# Patient Record
Sex: Male | Born: 1965 | Race: White | Hispanic: No | Marital: Married | Smoking: Never smoker
Health system: Southern US, Community
[De-identification: ages and names within clinical notes are randomized; demographics above are authoritative.]

## PROBLEM LIST (undated history)

## (undated) HISTORY — PX: KNEE SURGERY: SHX244

---

## 2008-12-01 ENCOUNTER — Encounter: Admission: RE | Admit: 2008-12-01 | Discharge: 2008-12-01 | Payer: Self-pay | Admitting: Chiropractic Medicine

## 2015-06-18 ENCOUNTER — Encounter (HOSPITAL_BASED_OUTPATIENT_CLINIC_OR_DEPARTMENT_OTHER): Payer: Self-pay | Admitting: *Deleted

## 2015-06-18 ENCOUNTER — Emergency Department (HOSPITAL_BASED_OUTPATIENT_CLINIC_OR_DEPARTMENT_OTHER)
Admission: EM | Admit: 2015-06-18 | Discharge: 2015-06-18 | Disposition: A | Discharge status: Home / Self Care | Payer: BLUE CROSS/BLUE SHIELD | Attending: Emergency Medicine | Admitting: Emergency Medicine

## 2015-06-18 DIAGNOSIS — R002 Palpitations: Secondary | ICD-10-CM | POA: Diagnosis not present

## 2015-06-18 DIAGNOSIS — R51 Headache: Secondary | ICD-10-CM | POA: Insufficient documentation

## 2015-06-18 DIAGNOSIS — M542 Cervicalgia: Secondary | ICD-10-CM | POA: Diagnosis not present

## 2015-06-18 LAB — CBC WITH DIFFERENTIAL/PLATELET
BASOS ABS: 0.1 10*3/uL (ref 0.0–0.1)
BASOS PCT: 2 %
EOS ABS: 0.1 10*3/uL (ref 0.0–0.7)
Eosinophils Relative: 1 %
HEMATOCRIT: 46.4 % (ref 39.0–52.0)
HEMOGLOBIN: 16.5 g/dL (ref 13.0–17.0)
Lymphocytes Relative: 30 %
Lymphs Abs: 2.4 10*3/uL (ref 0.7–4.0)
MCH: 30.2 pg (ref 26.0–34.0)
MCHC: 35.6 g/dL (ref 30.0–36.0)
MCV: 84.8 fL (ref 78.0–100.0)
Monocytes Absolute: 0.6 10*3/uL (ref 0.1–1.0)
Monocytes Relative: 8 %
NEUTROS ABS: 4.8 10*3/uL (ref 1.7–7.7)
NEUTROS PCT: 59 %
Platelets: 203 10*3/uL (ref 150–400)
RBC: 5.47 MIL/uL (ref 4.22–5.81)
RDW: 12.4 % (ref 11.5–15.5)
WBC: 7.9 10*3/uL (ref 4.0–10.5)

## 2015-06-18 LAB — BASIC METABOLIC PANEL
ANION GAP: 7 (ref 5–15)
BUN: 14 mg/dL (ref 6–20)
CALCIUM: 9.6 mg/dL (ref 8.9–10.3)
CO2: 26 mmol/L (ref 22–32)
CREATININE: 1.02 mg/dL (ref 0.61–1.24)
Chloride: 107 mmol/L (ref 101–111)
Glucose, Bld: 100 mg/dL — ABNORMAL HIGH (ref 65–99)
Potassium: 3.7 mmol/L (ref 3.5–5.1)
SODIUM: 140 mmol/L (ref 135–145)

## 2015-06-18 LAB — TROPONIN I

## 2015-06-18 NOTE — Discharge Instructions (Signed)
Keep a record of your blood pressure at home to take with you to your next appointment.  If your palpitations persist, see your doctor about arranging a holter monitor.  Return to the ER if your symptoms worsen or change.   Palpitations A palpitation is the feeling that your heartbeat is irregular or is faster than normal. It may feel like your heart is fluttering or skipping a beat. Palpitations are usually not a serious problem. However, in some cases, you may need further medical evaluation. CAUSES  Palpitations can be caused by:  Smoking.  Caffeine or other stimulants, such as diet pills or energy drinks.  Alcohol.  Stress and anxiety.  Strenuous physical activity.  Fatigue.  Certain medicines.  Heart disease, especially if you have a history of irregular heart rhythms (arrhythmias), such as atrial fibrillation, atrial flutter, or supraventricular tachycardia.  An improperly working pacemaker or defibrillator. DIAGNOSIS  To find the cause of your palpitations, your health care provider will take your medical history and perform a physical exam. Your health care provider may also have you take a test called an ambulatory electrocardiogram (ECG). An ECG records your heartbeat patterns over a 24-hour period. You may also have other tests, such as:  Transthoracic echocardiogram (TTE). During echocardiography, sound waves are used to evaluate how blood flows through your heart.  Transesophageal echocardiogram (TEE).  Cardiac monitoring. This allows your health care provider to monitor your heart rate and rhythm in real time.  Holter monitor. This is a portable device that records your heartbeat and can help diagnose heart arrhythmias. It allows your health care provider to track your heart activity for several days, if needed.  Stress tests by exercise or by giving medicine that makes the heart beat faster. TREATMENT  Treatment of palpitations depends on the cause of your  symptoms and can vary greatly. Most cases of palpitations do not require any treatment other than time, relaxation, and monitoring your symptoms. Other causes, such as atrial fibrillation, atrial flutter, or supraventricular tachycardia, usually require further treatment. HOME CARE INSTRUCTIONS   Avoid:  Caffeinated coffee, tea, soft drinks, diet pills, and energy drinks.  Chocolate.  Alcohol.  Stop smoking if you smoke.  Reduce your stress and anxiety. Things that can help you relax include:  A method of controlling things in your body, such as your heartbeats, with your mind (biofeedback).  Yoga.  Meditation.  Physical activity such as swimming, jogging, or walking.  Get plenty of rest and sleep. SEEK MEDICAL CARE IF:   You continue to have a fast or irregular heartbeat beyond 24 hours.  Your palpitations occur more often. SEEK IMMEDIATE MEDICAL CARE IF:  You have chest pain or shortness of breath.  You have a severe headache.  You feel dizzy or you faint. MAKE SURE YOU:  Understand these instructions.  Will watch your condition.  Will get help right away if you are not doing well or get worse.   This information is not intended to replace advice given to you by your health care provider. Make sure you discuss any questions you have with your health care provider.   Document Released: 07/29/2000 Document Revised: 08/06/2013 Document Reviewed: 09/30/2011 Elsevier Interactive Patient Education 2016 [employer] opr

## 2015-06-18 NOTE — ED Notes (Signed)
[facility] He had it checked on his wifes recommendation due to face being flushed. Pain in the back of his neck.

## 2015-06-18 NOTE — ED Provider Notes (Signed)
CSN: [id number]     Arrival date & time 06/18/15  1945 History  By signing my name below, I, Gonzella LexKimberly Bianca Gray, attest that this documentation has been prepared under the direction and in the presence of Geoffery Lyonsouglas Izzac Rockett, MD. Electronically Signed: Gonzella LexKimberly Bianca Gray, Scribe. 06/18/2015. 8:45 PM.    Chief Complaint  Patient presents with  . Hypertension    The history is provided by the patient. No language interpreter was used.    HPI Comments: Gabriel EaringJon Forde is a 49 y.o. male who presents to the Emergency Department complaining of intermittent, moderate palpitations that last about 45 seconds before resolving on their own and elevated BP of 151/109 onset yesterday morning. Pt has associated clammy hands, posterior HA and neck pain. He tried Aleve with no relief. Pt's wife reports increased stress at work lately and pt also reports frequent use of caffeine in the last few days. Pt denies chest pain, SOB, abdominal pain, and lightheadedness. Pt saw a physician in urgent care earlier today who prescried blood pressure medicine. Pt reports being pretty healthy otherwise.  History reviewed. No pertinent past medical history. History reviewed. No pertinent past surgical history. No family history on file. Social History  Substance Use Topics  . Smoking status: Never Smoker   . Smokeless tobacco: None  . Alcohol Use: Yes     Comment: weekends    Review of Systems  Respiratory: Negative for shortness of breath.   Cardiovascular: Positive for palpitations. Negative for chest pain.  Gastrointestinal: Negative for abdominal pain.  Musculoskeletal: Positive for neck pain.  Neurological: Positive for headaches. Negative for light-headedness.  All other systems reviewed and are negative.   Allergies  Review of patient's allergies indicates no known allergies.  Home Medications   Prior to Admission medications   Medication Sig Start Date End Date Taking? Authorizing Provider  Cetirizine HCl  (ZYRTEC PO) Take by mouth.   Yes Historical Provider, MD   BP 137/91 mmHg  Pulse 81  Temp(Src) 98.2 F (36.8 C) (Oral)  Resp 18  Ht 6\' 3"  (1.905 m)  Wt 208 lb (94.348 kg)  BMI 26.00 kg/m2  SpO2 98% Physical Exam  Constitutional: He is oriented to person, place, and time. He appears well-developed and well-nourished. No distress.  HENT:  Head: Normocephalic.  Eyes: Conjunctivae are normal.  Neck: Normal range of motion. Neck supple.  Cardiovascular: Normal rate, regular rhythm and normal heart sounds.   No murmur heard. Pulmonary/Chest: Effort normal and breath sounds normal. No stridor. No respiratory distress. He has no wheezes. He has no rales.  Abdominal: Soft. Bowel sounds are normal. He exhibits no distension. There is no tenderness.  Neurological: He is alert and oriented to person, place, and time.  Skin: Skin is warm and dry.  Psychiatric: He has a normal mood and affect.  Nursing note and vitals reviewed.   ED Course  Procedures  DIAGNOSTIC STUDIES:    Oxygen Saturation is 98% on RA, normal by my interpretation.   COORDINATION OF CARE: 8:04 PM Will order labwork. Discussed treatment plan with pt at bedside and pt agreed to plan.    Labs Review Labs Reviewed - No data to display  Imaging Review No results found. I have personally reviewed and evaluated these lab results as part of my medical decision-making.   EKG Interpretation   Date/Time:  Thursday June 18 2015 20:08:27 EDT Ventricular Rate:  66 PR Interval:  162 QRS Duration: 94 QT Interval:  376 QTC Calculation: 394 R  Axis:   -5 Text Interpretation:  Normal sinus rhythm Normal ECG Confirmed by Kazandra Forstrom   MD, Sherlonda Flater ([id number]) on 06/18/2015 9:03:25 PM      MDM   Final diagnoses:  None    Workup is unremarkable.  His blood pressures are not emergently concerning and I recommend nothing but observation at this time.  He is to keep a record of them at home.  He does report some palpitations this  week.  His labs and ekg are unremarkable.  Will recommend him to follow up with his pcp to discuss a holter monitor if his palpitations persist.  I personally performed the services described in this documentation, which was scribed in my presence. The recorded information has been reviewed and is accurate.         Geoffery Lyons, MD 06/18/15 2154

## 2016-11-28 ENCOUNTER — Emergency Department (HOSPITAL_BASED_OUTPATIENT_CLINIC_OR_DEPARTMENT_OTHER): Payer: BLUE CROSS/BLUE SHIELD

## 2016-11-28 ENCOUNTER — Emergency Department (HOSPITAL_BASED_OUTPATIENT_CLINIC_OR_DEPARTMENT_OTHER)
Admission: EM | Admit: 2016-11-28 | Discharge: 2016-11-28 | Disposition: A | Discharge status: Home / Self Care | Payer: BLUE CROSS/BLUE SHIELD | Attending: Emergency Medicine | Admitting: Emergency Medicine

## 2016-11-28 ENCOUNTER — Encounter (HOSPITAL_BASED_OUTPATIENT_CLINIC_OR_DEPARTMENT_OTHER): Payer: Self-pay | Admitting: Emergency Medicine

## 2016-11-28 DIAGNOSIS — R109 Unspecified abdominal pain: Secondary | ICD-10-CM | POA: Diagnosis present

## 2016-11-28 DIAGNOSIS — R1011 Right upper quadrant pain: Secondary | ICD-10-CM | POA: Insufficient documentation

## 2016-11-28 LAB — CBC WITH DIFFERENTIAL/PLATELET
BASOS PCT: 1 %
Basophils Absolute: 0.1 10*3/uL (ref 0.0–0.1)
EOS ABS: 0.1 10*3/uL (ref 0.0–0.7)
Eosinophils Relative: 1 %
HEMATOCRIT: 45.6 % (ref 39.0–52.0)
HEMOGLOBIN: 16.6 g/dL (ref 13.0–17.0)
LYMPHS ABS: 1.3 10*3/uL (ref 0.7–4.0)
Lymphocytes Relative: 14 %
MCH: 31.1 pg (ref 26.0–34.0)
MCHC: 36.4 g/dL — ABNORMAL HIGH (ref 30.0–36.0)
MCV: 85.6 fL (ref 78.0–100.0)
Monocytes Absolute: 0.8 10*3/uL (ref 0.1–1.0)
Monocytes Relative: 9 %
NEUTROS ABS: 7.1 10*3/uL (ref 1.7–7.7)
NEUTROS PCT: 77 %
Platelets: 161 10*3/uL (ref 150–400)
RBC: 5.33 MIL/uL (ref 4.22–5.81)
RDW: 12.4 % (ref 11.5–15.5)
WBC: 9.3 10*3/uL (ref 4.0–10.5)

## 2016-11-28 LAB — D-DIMER, QUANTITATIVE: D-Dimer, Quant: 0.34 ug/mL-FEU (ref 0.00–0.50)

## 2016-11-28 LAB — URINALYSIS, ROUTINE W REFLEX MICROSCOPIC
BILIRUBIN URINE: NEGATIVE
Glucose, UA: NEGATIVE mg/dL
Hgb urine dipstick: NEGATIVE
KETONES UR: NEGATIVE mg/dL
LEUKOCYTES UA: NEGATIVE
NITRITE: NEGATIVE
Protein, ur: NEGATIVE mg/dL
SPECIFIC GRAVITY, URINE: 1.007 (ref 1.005–1.030)
pH: 6.5 (ref 5.0–8.0)

## 2016-11-28 LAB — COMPREHENSIVE METABOLIC PANEL
ALBUMIN: 4.3 g/dL (ref 3.5–5.0)
ALK PHOS: 57 U/L (ref 38–126)
ALT: 21 U/L (ref 17–63)
AST: 21 U/L (ref 15–41)
Anion gap: 7 (ref 5–15)
BUN: 10 mg/dL (ref 6–20)
CALCIUM: 9.6 mg/dL (ref 8.9–10.3)
CO2: 25 mmol/L (ref 22–32)
CREATININE: 0.96 mg/dL (ref 0.61–1.24)
Chloride: 104 mmol/L (ref 101–111)
GFR calc Af Amer: >60 mL/min (ref >60)
GFR calc non Af Amer: >60 mL/min (ref >60)
GLUCOSE: 106 mg/dL — AB (ref 65–99)
Potassium: 3.7 mmol/L (ref 3.5–5.1)
SODIUM: 136 mmol/L (ref 135–145)
Total Bilirubin: 1.7 mg/dL — ABNORMAL HIGH (ref 0.3–1.2)
Total Protein: 6.9 g/dL (ref 6.5–8.1)

## 2016-11-28 LAB — LIPASE, BLOOD: Lipase: 20 U/L (ref 11–51)

## 2016-11-28 MED ORDER — KETOROLAC TROMETHAMINE 15 MG/ML IJ SOLN
15.0000 mg | Freq: Once | INTRAMUSCULAR | Status: AC
  Administered 2016-11-28: 15 mg via INTRAVENOUS
  Filled 2016-11-28: qty 1

## 2016-11-28 MED ORDER — HYDROCODONE-ACETAMINOPHEN 5-325 MG PO TABS: 1.0000 | ORAL_TABLET | Freq: Four times a day (QID) | ORAL | 0 refills | Status: AC | PRN

## 2016-11-28 NOTE — ED Triage Notes (Signed)
R flank pain that started Saturday. Denies any other symptoms. Ambulatory to room.

## 2016-11-28 NOTE — ED Provider Notes (Addendum)
[facility] Provider Note: Lowella Dell, MD, FACEP  CSN: [id number] MRN: [id number] ARRIVAL: 11/28/16 at 0455 ROOM: [facility]09/[facility]09   CHIEF COMPLAINT  Flank Pain   HISTORY OF PRESENT ILLNESS  Mitchell Kim is a 51 y.o. male who has had the gradual onset of right flank pain over the past 48 hours. The pain is constant and has both sharp and dull components. He localizes the pain in the right flank radiating into his right upper quadrant. Pain is worse with movement, coughing and deep breathing. He has had no fever, nausea, vomiting, diarrhea, dysuria or hematuria. There is no associated rash. Pain is moderate to severe.  Consultation with the [facility] state controlled substances database reveals the patient has received No opioid prescriptions the past year.  History reviewed. No pertinent past medical history.  History reviewed. No pertinent surgical history.  History reviewed. No pertinent family history.  Social History  Substance Use Topics  . Smoking status: Never Smoker  . Smokeless tobacco: Never Used  . Alcohol use Yes     Comment: weekends    Prior to Admission medications   Medication Sig Start Date End Date Taking? Authorizing Provider  Cetirizine HCl (ZYRTEC PO) Take by mouth.    Historical Provider, MD  HYDROcodone-acetaminophen (NORCO) 5-325 MG tablet Take 1 tablet by mouth every 6 (six) hours as needed (for pain). 11/28/16   Paula Libra, MD    Allergies Patient has no known allergies.   REVIEW OF SYSTEMS  Negative except as noted here or in the History of Present Illness.   PHYSICAL EXAMINATION  Initial Vital Signs Blood pressure (!) 152/94, pulse 84, resp. rate 19, height  (1.905 m), weight 215 lb (97.5 kg), SpO2 98 %.  Examination General: Well-developed, well-nourished male in no acute distress; appearance consistent with age of record HENT: normocephalic; atraumatic Eyes: pupils equal, round and reactive to light; extraocular  muscles intact Neck: supple Heart: regular rate and rhythm Lungs: Decreased breath sounds right base Abdomen: soft; nondistended; right upper quadrant tenderness without definite Murphy sign; no masses or hepatosplenomegaly; bowel sounds present GU: Right flank tenderness, well localized Extremities: No deformity; full range of motion; pulses normal Neurologic: Awake, alert and oriented; motor function intact in all extremities and symmetric; no facial droop Skin: Warm and dry; no rash seen Psychiatric: Normal mood and affect   RESULTS  Summary of this visit's results, reviewed by myself:   EKG Interpretation  Date/Time:    Ventricular Rate:    PR Interval:    QRS Duration:   QT Interval:    QTC Calculation:   R Axis:     Text Interpretation:        Laboratory Studies: Results for orders placed or performed during the hospital encounter of 11/28/16 (from the past 24 hour(s))  Urinalysis, Routine w reflex microscopic     Status: None   Collection Time: 11/28/16  5:10 AM  Result Value Ref Range   Color, Urine YELLOW YELLOW   APPearance CLEAR CLEAR   Specific Gravity, Urine 1.007 1.005 - 1.030   pH 6.5 5.0 - 8.0   Glucose, UA NEGATIVE NEGATIVE mg/dL   Hgb urine dipstick NEGATIVE NEGATIVE   Bilirubin Urine NEGATIVE NEGATIVE   Ketones, ur NEGATIVE NEGATIVE mg/dL   Protein, ur NEGATIVE NEGATIVE mg/dL   Nitrite NEGATIVE NEGATIVE   Leukocytes, UA NEGATIVE NEGATIVE  CBC with Differential/Platelet     Status: Abnormal   Collection Time: 11/28/16  5:43 AM  Result  Value Ref Range   WBC 9.3 4.0 - 10.5 K/uL   RBC 5.33 4.22 - 5.81 MIL/uL   Hemoglobin 16.6 13.0 - 17.0 g/dL   HCT 16.1 09.6 - 04.5 %   MCV 85.6 78.0 - 100.0 fL   MCH 31.1 26.0 - 34.0 pg   MCHC 36.4 (H) 30.0 - 36.0 g/dL   RDW 40.9 81.1 - 91.4 %   Platelets 161 150 - 400 K/uL   Neutrophils Relative % 77 %   Neutro Abs 7.1 1.7 - 7.7 K/uL   Lymphocytes Relative 14 %   Lymphs Abs 1.3 0.7 - 4.0 K/uL   Monocytes  Relative 9 %   Monocytes Absolute 0.8 0.1 - 1.0 K/uL   Eosinophils Relative 1 %   Eosinophils Absolute 0.1 0.0 - 0.7 K/uL   Basophils Relative 1 %   Basophils Absolute 0.1 0.0 - 0.1 K/uL  Comprehensive metabolic panel     Status: Abnormal   Collection Time: 11/28/16  5:43 AM  Result Value Ref Range   Sodium 136 135 - 145 mmol/L   Potassium 3.7 3.5 - 5.1 mmol/L   Chloride 104 101 - 111 mmol/L   CO2 25 22 - 32 mmol/L   Glucose, Bld 106 (H) 65 - 99 mg/dL   BUN 10 6 - 20 mg/dL   Creatinine, Ser 7.82 0.61 - 1.24 mg/dL   Calcium 9.6 8.9 - 95.6 mg/dL   Total Protein 6.9 6.5 - 8.1 g/dL   Albumin 4.3 3.5 - 5.0 g/dL   AST 21 15 - 41 U/L   ALT 21 17 - 63 U/L   Alkaline Phosphatase 57 38 - 126 U/L   Total Bilirubin 1.7 (H) 0.3 - 1.2 mg/dL   GFR calc non Af Amer >60 >60 mL/min   GFR calc Af Amer >60 >60 mL/min   Anion gap 7 5 - 15  Lipase, blood     Status: None   Collection Time: 11/28/16  5:43 AM  Result Value Ref Range   Lipase 20 11 - 51 U/L  D-dimer, quantitative (not at [facility])     Status: None   Collection Time: 11/28/16  5:43 AM  Result Value Ref Range   D-Dimer, Quant 0.34 0.00 - 0.50 ug/mL-FEU   Imaging Studies: Ct Renal Stone Study  Result Date: 11/28/2016 CLINICAL DATA:  Right flank pain for 2 days EXAM: CT ABDOMEN AND PELVIS WITHOUT CONTRAST TECHNIQUE: Multidetector CT imaging of the abdomen and pelvis was performed following the standard protocol without IV contrast. COMPARISON:  None. FINDINGS: Lower chest: No pulmonary nodules or pleural effusion. No visible pericardial effusion. Bibasilar dependent atelectasis. Hepatobiliary: Normal noncontrast appearance of the liver. No visible biliary dilatation. Cholelithiasis without acute inflammation. Pancreas: Normal noncontrast appearance of the pancreas. No peripancreatic fluid collection. Spleen: Normal. Adrenal glands: Normal. Urinary Tract: --Right kidney/ureter: No hydronephrosis or perinephric stranding. No nephrolithiasis. No  obstructing ureteral stones. --Left kidney/ureter: No hydronephrosis or perinephric stranding. No nephrolithiasis. No obstructing ureteral stones. --Urinary bladder: Unremarkable. Stomach/Bowel: No dilated loops of bowel. No evidence of colonic or enteric inflammation. No fluid collection within the abdomen. The appendix measures upper limits of normal, but there is no surrounding free fluid or inflammatory change. Vascular/Lymphatic: There is atherosclerotic calcification of the non aneurysmal abdominal aorta. No abdominal or pelvic lymphadenopathy. Reproductive: Mild enlargement of the prostate. Musculoskeletal. No focal osseous lesion. Normal visualized extraperitoneal and extrathoracic soft tissues. IMPRESSION: 1. No obstructive uropathy or nephrolithiasis. 2. Appendix measures at the upper limits of normal,  but there is no periappendiceal fluid or inflammatory stranding. 3. Aortic atherosclerosis. Electronically Signed   By: Deatra Robinson M.D.   On: 11/28/2016 06:42    ED COURSE  Nursing notes and initial vitals signs, including pulse oximetry, reviewed.  Vitals:   11/28/16 0509 11/28/16 0510  BP: (!) 152/94   Pulse: 84   Resp: 19   Temp: 98.8 F (37.1 C)   TempSrc: Oral   SpO2: 98%   Weight:  215 lb (97.5 kg)  Height:   (1.905 m)   6:50 AM Patient advised of CT and lab findings. I doubt early appendicitis as his symptoms of been present for 48 hours and there are no inflammatory changes seen on CT. He was advised to be vigilant however and to return if symptoms are worsening or he develops new symptoms such as fever. We will treat his pain in the meantime.  PROCEDURES    ED DIAGNOSES     ICD-9-CM ICD-10-CM   1. Right flank pain 789.09 R10.9        Paula Libra, MD 11/28/16 4098    Paula Libra, MD 11/28/16 [id number]

## 2019-07-06 ENCOUNTER — Emergency Department (HOSPITAL_BASED_OUTPATIENT_CLINIC_OR_DEPARTMENT_OTHER): Payer: Managed Care, Other (non HMO)

## 2019-07-06 ENCOUNTER — Emergency Department (HOSPITAL_BASED_OUTPATIENT_CLINIC_OR_DEPARTMENT_OTHER)
Admission: EM | Admit: 2019-07-06 | Discharge: 2019-07-07 | Disposition: A | Discharge status: Home / Self Care | Payer: Managed Care, Other (non HMO) | Attending: Emergency Medicine | Admitting: Emergency Medicine

## 2019-07-06 ENCOUNTER — Encounter (HOSPITAL_BASED_OUTPATIENT_CLINIC_OR_DEPARTMENT_OTHER): Payer: Self-pay | Admitting: Emergency Medicine

## 2019-07-06 ENCOUNTER — Other Ambulatory Visit: Payer: Self-pay

## 2019-07-06 DIAGNOSIS — R1013 Epigastric pain: Secondary | ICD-10-CM

## 2019-07-06 DIAGNOSIS — R079 Chest pain, unspecified: Secondary | ICD-10-CM | POA: Diagnosis not present

## 2019-07-06 DIAGNOSIS — R109 Unspecified abdominal pain: Secondary | ICD-10-CM | POA: Diagnosis not present

## 2019-07-06 LAB — CBC WITH DIFFERENTIAL/PLATELET
Abs Immature Granulocytes: 0.05 10*3/uL (ref 0.00–0.07)
Basophils Absolute: 0.2 10*3/uL — ABNORMAL HIGH (ref 0.0–0.1)
Basophils Relative: 1 %
Eosinophils Absolute: 0.1 10*3/uL (ref 0.0–0.5)
Eosinophils Relative: 0 %
HCT: 47.3 % (ref 39.0–52.0)
Hemoglobin: 16.5 g/dL (ref 13.0–17.0)
Immature Granulocytes: 0 %
Lymphocytes Relative: 19 %
Lymphs Abs: 2.3 10*3/uL (ref 0.7–4.0)
MCH: 30.4 pg (ref 26.0–34.0)
MCHC: 34.9 g/dL (ref 30.0–36.0)
MCV: 87.3 fL (ref 80.0–100.0)
Monocytes Absolute: 0.6 10*3/uL (ref 0.1–1.0)
Monocytes Relative: 5 %
Neutro Abs: 8.8 10*3/uL — ABNORMAL HIGH (ref 1.7–7.7)
Neutrophils Relative %: 75 %
Platelets: 210 10*3/uL (ref 150–400)
RBC: 5.42 MIL/uL (ref 4.22–5.81)
RDW: 11.8 % (ref 11.5–15.5)
WBC: 11.9 10*3/uL — ABNORMAL HIGH (ref 4.0–10.5)
nRBC: 0 % (ref 0.0–0.2)

## 2019-07-06 LAB — URINALYSIS, ROUTINE W REFLEX MICROSCOPIC
Bilirubin Urine: NEGATIVE
Glucose, UA: NEGATIVE mg/dL
Hgb urine dipstick: NEGATIVE
Ketones, ur: 15 mg/dL — AB
Leukocytes,Ua: NEGATIVE
Nitrite: NEGATIVE
Protein, ur: NEGATIVE mg/dL
Specific Gravity, Urine: 1.01 (ref 1.005–1.030)
pH: 7 (ref 5.0–8.0)

## 2019-07-06 LAB — COMPREHENSIVE METABOLIC PANEL
ALT: 33 U/L (ref 0–44)
AST: 28 U/L (ref 15–41)
Albumin: 4.6 g/dL (ref 3.5–5.0)
Alkaline Phosphatase: 66 U/L (ref 38–126)
Anion gap: 11 (ref 5–15)
BUN: 19 mg/dL (ref 6–20)
CO2: 24 mmol/L (ref 22–32)
Calcium: 9.6 mg/dL (ref 8.9–10.3)
Chloride: 104 mmol/L (ref 98–111)
Creatinine, Ser: 1.19 mg/dL (ref 0.61–1.24)
GFR calc Af Amer: >60 mL/min (ref >60)
GFR calc non Af Amer: >60 mL/min (ref >60)
Glucose, Bld: 127 mg/dL — ABNORMAL HIGH (ref 70–99)
Potassium: 3.4 mmol/L — ABNORMAL LOW (ref 3.5–5.1)
Sodium: 139 mmol/L (ref 135–145)
Total Bilirubin: 1.2 mg/dL (ref 0.3–1.2)
Total Protein: 7.2 g/dL (ref 6.5–8.1)

## 2019-07-06 LAB — LIPASE, BLOOD: Lipase: 31 U/L (ref 11–51)

## 2019-07-06 LAB — TROPONIN I (HIGH SENSITIVITY): Troponin I (High Sensitivity): <2 ng/L (ref <18)

## 2019-07-06 MED ORDER — PANTOPRAZOLE SODIUM 40 MG PO TBEC: 40.0000 mg | DELAYED_RELEASE_TABLET | Freq: Every day | ORAL | 0 refills | Status: AC

## 2019-07-06 MED ORDER — IOHEXOL 350 MG/ML SOLN
100.0000 mL | Freq: Once | INTRAVENOUS | Status: AC | PRN
  Administered 2019-07-06: 23:00:00 100 mL via INTRAVENOUS

## 2019-07-06 MED ORDER — FAMOTIDINE IN NACL 20-0.9 MG/50ML-% IV SOLN
20.0000 mg | Freq: Once | INTRAVENOUS | Status: AC
  Administered 2019-07-06: 22:00:00 20 mg via INTRAVENOUS
  Filled 2019-07-06: qty 50

## 2019-07-06 MED ORDER — ONDANSETRON HCL 4 MG/2ML IJ SOLN
4.0000 mg | Freq: Once | INTRAMUSCULAR | Status: AC
  Administered 2019-07-06: 4 mg via INTRAVENOUS
  Filled 2019-07-06: qty 2

## 2019-07-06 MED ORDER — MORPHINE SULFATE (PF) 4 MG/ML IV SOLN
4.0000 mg | Freq: Once | INTRAVENOUS | Status: AC
  Administered 2019-07-06: 4 mg via INTRAVENOUS
  Filled 2019-07-06: qty 1

## 2019-07-06 MED ORDER — ONDANSETRON 4 MG PO TBDP: ORAL_TABLET | ORAL | 0 refills | Status: DC

## 2019-07-06 MED ORDER — SODIUM CHLORIDE 0.9 % IV BOLUS
1000.0000 mL | Freq: Once | INTRAVENOUS | Status: AC
  Administered 2019-07-06: 22:00:00 1000 mL via INTRAVENOUS

## 2019-07-06 NOTE — ED Provider Notes (Addendum)
[facility] EMERGENCY DEPARTMENT Provider Note   CSN: [id number] Arrival date & time: 07/06/19  2035     History   Chief Complaint Chief Complaint  Patient presents with  . Abdominal Pain  . Back Pain    HPI Mitchell Kim is a 53 y.o. male here presenting with abdominal pain, chest pain, back pain.  Patient states that about 4 hours prior to arrival, he had sudden onset of epigastric pain radiated to his back .  He states that he felt diaphoretic and was sweaty.  He never had this kind of pain before.  Denies any abdominal surgeries or history of heart problems.  Patient appears diaphoretic in triage.     The history is provided by the patient.    History reviewed. No pertinent past medical history.  There are no active problems to display for this patient.   Past Surgical History:  Procedure Laterality Date  . KNEE SURGERY          Home Medications    Prior to Admission medications   Medication Sig Start Date End Date Taking? Authorizing Provider  Cetirizine HCl (ZYRTEC PO) Take by mouth.    [provider]  HYDROcodone-acetaminophen (NORCO) 5-325 MG tablet Take 1 tablet by mouth every 6 (six) hours as needed (for pain). 11/28/16   Molpus, John, MD  ondansetron (ZOFRAN ODT) 4 MG disintegrating tablet 4mg  ODT q4 hours prn nausea/vomit 07/06/19   Charlynne PanderYao, Christl Fessenden Hsienta, MD  pantoprazole (PROTONIX) 40 MG tablet Take 1 tablet (40 mg total) by mouth daily. 07/06/19   Charlynne PanderYao, Mose Colaizzi Hsienta, MD    Family History Family History  Problem Relation Age of Onset  . Cancer Mother     Social History Social History   Tobacco Use  . Smoking status: Never Smoker  . Smokeless tobacco: Never Used  Substance Use Topics  . Alcohol use: Yes    Comment: rare  . Drug use: No     Allergies   Patient has no known allergies.   Review of Systems Review of Systems  Gastrointestinal: Positive for abdominal pain.  Musculoskeletal: Positive for back pain.  All  other systems reviewed and are negative.    Physical Exam Updated Vital Signs BP 123/72   Pulse 75   Temp 98 F (36.7 C) (Oral)   Resp 11   Ht 6\' 3"  (1.905 m)   Wt 96.2 kg   SpO2 93%   BMI 26.50 kg/m   Physical Exam Vitals signs and nursing note reviewed.  Constitutional:      Comments: Uncomfortable, writhing around in pain   HENT:     Head: Normocephalic.     Mouth/Throat:     Mouth: Mucous membranes are moist.  Eyes:     Extraocular Movements: Extraocular movements intact.  Cardiovascular:     Rate and Rhythm: Normal rate and regular rhythm.     Heart sounds: Normal heart sounds.  Pulmonary:     Effort: Pulmonary effort is normal.     Breath sounds: Normal breath sounds.  Abdominal:     General: Abdomen is flat.     Comments: + RUQ tenderness, mild R CVAT   Skin:    General: Skin is warm.     Capillary Refill: Capillary refill takes less than 2 seconds.  Neurological:     General: No focal deficit present.     Mental Status: He is alert and oriented to person, place, and time.  Psychiatric:  Mood and Affect: Mood normal.        Behavior: Behavior normal.      ED Treatments / Results  Labs (all labs ordered are listed, but only abnormal results are displayed) Labs Reviewed  CBC WITH DIFFERENTIAL/PLATELET - Abnormal; Notable for the following components:      Result Value   WBC 11.9 (*)    Neutro Abs 8.8 (*)    Basophils Absolute 0.2 (*)    All other components within normal limits  COMPREHENSIVE METABOLIC PANEL - Abnormal; Notable for the following components:   Potassium 3.4 (*)    Glucose, Bld 127 (*)    All other components within normal limits  LIPASE, BLOOD  URINALYSIS, ROUTINE W REFLEX MICROSCOPIC  TROPONIN I (HIGH SENSITIVITY)  TROPONIN I (HIGH SENSITIVITY)    EKG EKG Interpretation  Date/Time:  Saturday July 06 2019 21:12:23 EST Ventricular Rate:  81 PR Interval:    QRS Duration: 101 QT Interval:  391 QTC Calculation:  454 R Axis:   -10 Text Interpretation: Sinus rhythm No significant change since last tracing Confirmed by Wandra Arthurs [id number]) on 07/06/2019 9:31:50 PM   Radiology Ct Angio Chest/abd/pel For Dissection W And/or Wo Contrast  Result Date: 07/06/2019 CLINICAL DATA:  Chest and back pain. Concern for aortic dissection. EXAM: CT ANGIOGRAPHY CHEST, ABDOMEN AND PELVIS TECHNIQUE: Multidetector CT imaging through the chest, abdomen and pelvis was performed using the standard protocol during bolus administration of intravenous contrast. Multiplanar reconstructed images and MIPs were obtained and reviewed to evaluate the vascular anatomy. CONTRAST:  166mL OMNIPAQUE IOHEXOL 350 MG/ML SOLN COMPARISON:  CT dated 11/28/2016. FINDINGS: CTA CHEST FINDINGS Cardiovascular: There is no evidence for an aortic dissection, however evaluation is limited by suboptimal contrast bolus timing. There is no large centrally located pulmonary embolism. Detection of smaller segmental and subsegmental pulmonary emboli is limited by suboptimal contrast bolus timing. There are no significant atherosclerotic changes of the thoracic aorta. There is no aneurysm of the thoracic aorta. The heart size is normal. There is no pericardial effusion. Mediastinum/Nodes: --No mediastinal or hilar lymphadenopathy. --No axillary lymphadenopathy. --No supraclavicular lymphadenopathy. --Normal thyroid gland. --The esophagus is unremarkable Lungs/Pleura: No pulmonary nodules or masses. No pleural effusion or pneumothorax. No focal airspace consolidation. No focal pleural abnormality. Musculoskeletal: No chest wall abnormality. No acute or significant osseous findings. Review of the MIP images confirms the above findings. CTA ABDOMEN AND PELVIS FINDINGS VASCULAR Aorta: Normal caliber aorta without aneurysm, dissection, vasculitis or significant stenosis. Celiac: Patent without evidence of aneurysm, dissection, vasculitis or significant stenosis. SMA: Patent  without evidence of aneurysm, dissection, vasculitis or significant stenosis. Renals: Both renal arteries are patent without evidence of aneurysm, dissection, vasculitis, fibromuscular dysplasia or significant stenosis. IMA: Patent without evidence of aneurysm, dissection, vasculitis or significant stenosis. Inflow: Patent without evidence of aneurysm, dissection, vasculitis or significant stenosis. Veins: No obvious venous abnormality within the limitations of this arterial phase study. Review of the MIP images confirms the above findings. NON-VASCULAR Hepatobiliary: The liver is normal. Normal gallbladder.There is no biliary ductal dilation. Pancreas: Normal contours without ductal dilatation. No peripancreatic fluid collection. Spleen: No splenic laceration or hematoma. Adrenals/Urinary Tract: --Adrenal glands: No adrenal hemorrhage. --Right kidney/ureter: No hydronephrosis or perinephric hematoma. --Left kidney/ureter: No hydronephrosis or perinephric hematoma. --Urinary bladder: Unremarkable. Stomach/Bowel: --Stomach/Duodenum: No hiatal hernia or other gastric abnormality. Normal duodenal course and caliber. --Small bowel: No dilatation or inflammation. --Colon: No focal abnormality. --Appendix: Normal. Lymphatic: --No retroperitoneal lymphadenopathy. --No mesenteric lymphadenopathy. --No pelvic or  inguinal lymphadenopathy. Reproductive: Unremarkable Other: No ascites or free air. There are bilateral fat containing inguinal hernias. Musculoskeletal. No acute displaced fractures. Review of the MIP images confirms the above findings. IMPRESSION: No acute thoracic, abdominal or pelvic pathology. Specifically, there is no evidence for aortic dissection, however, evaluation is limited by suboptimal contrast bolus timing. There is no large centrally located pulmonary embolism. Detection of smaller segmental and subsegmental pulmonary emboli is limited by suboptimal contrast bolus timing. Electronically Signed   By:  Katherine Mantle M.D.   On: 07/06/2019 23:04    Procedures Procedures (including critical care time)   EMERGENCY DEPARTMENT BILIARY ULTRASOUND INTERPRETATION "Study: Limited Abdominal Ultrasound of the Gallbladder and Common Bile Duct."  INDICATIONS: Abdominal pain Indication: Multiple views of the gallbladder and common bile duct were obtained in real-time with a Multi-frequency probe."  PERFORMED BY:  Myself IMAGES ARCHIVED?: Yes LIMITATIONS: Body habitus INTERPRETATION: Normal   Medications Ordered in ED Medications  sodium chloride 0.9 % bolus 1,000 mL (1,000 mLs Intravenous New Bag/Given 07/06/19 2138)  morphine 4 MG/ML injection 4 mg (4 mg Intravenous Given 07/06/19 2140)  ondansetron (ZOFRAN) injection 4 mg (4 mg Intravenous Given 07/06/19 2139)  famotidine (PEPCID) IVPB 20 mg premix (0 mg Intravenous Stopped 07/06/19 2213)  iohexol (OMNIPAQUE) 350 MG/ML injection 100 mL (100 mLs Intravenous Contrast Given 07/06/19 2235)     Initial Impression / Assessment and Plan / ED Course  I have reviewed the triage vital signs and the nursing notes.  Pertinent labs & imaging results that were available during my care of the patient were reviewed by me and considered in my medical decision making (see chart for details).       Rydge Texidor is a 53 y.o. male here with ab pain, back pain.  He appears very uncomfortable.  Bedside right upper quadrant ultrasound showed no obvious gallstones .  Will get CTA to rule out dissection. Otherwise, consider gastritis vs renal colic, less likely ACS.   11:36 PM Labs unremarkable. Trop neg x 1. CTA showed no dissection. Second troponin and UA pending. Likely biliary colic vs gastritis. Anticipate dc home if trop and UA unremarkable. Signed out to Dr. Nicanor Alcon in the ED. Will refer to GI outpatient.   Final Clinical Impressions(s) / ED Diagnoses   Final diagnoses:  Epigastric pain  Flank pain    ED Discharge Orders         Ordered     ondansetron (ZOFRAN ODT) 4 MG disintegrating tablet     07/06/19 2325    pantoprazole (PROTONIX) 40 MG tablet  Daily     07/06/19 2325           Charlynne Pander, MD 07/06/19 2325    Charlynne Pander, MD 07/06/19 [phone]

## 2019-07-06 NOTE — ED Triage Notes (Signed)
Pt is c/o upper abd pain and back pain   Pt states it started about 4 hours ago  Denies N/V/D

## 2019-07-06 NOTE — Discharge Instructions (Signed)
Take protonix daily   Take zofran as needed for nausea.   You may have passed gallstone   See your doctor. Consider following up with GI   Return to ER if you have worse abdominal pain, vomiting, fever, chest pain

## 2019-07-06 NOTE — ED Notes (Signed)
While triaging pt he became pale and diaphoretic

## 2019-07-06 NOTE — ED Notes (Signed)
Pt is in CT

## 2019-07-06 NOTE — ED Notes (Signed)
Returned from CT.

## 2019-07-07 LAB — TROPONIN I (HIGH SENSITIVITY): Troponin I (High Sensitivity): 3 ng/L (ref <18)

## 2020-03-22 ENCOUNTER — Emergency Department (HOSPITAL_BASED_OUTPATIENT_CLINIC_OR_DEPARTMENT_OTHER): Payer: BC Managed Care – PPO

## 2020-03-22 ENCOUNTER — Emergency Department (HOSPITAL_BASED_OUTPATIENT_CLINIC_OR_DEPARTMENT_OTHER)
Admission: EM | Admit: 2020-03-22 | Discharge: 2020-03-23 | Disposition: A | Discharge status: Home / Self Care | Payer: BC Managed Care – PPO | Attending: Emergency Medicine | Admitting: Emergency Medicine

## 2020-03-22 ENCOUNTER — Encounter (HOSPITAL_BASED_OUTPATIENT_CLINIC_OR_DEPARTMENT_OTHER): Payer: Self-pay | Admitting: Emergency Medicine

## 2020-03-22 DIAGNOSIS — M549 Dorsalgia, unspecified: Secondary | ICD-10-CM | POA: Diagnosis not present

## 2020-03-22 DIAGNOSIS — R112 Nausea with vomiting, unspecified: Secondary | ICD-10-CM

## 2020-03-22 DIAGNOSIS — N401 Enlarged prostate with lower urinary tract symptoms: Secondary | ICD-10-CM | POA: Diagnosis not present

## 2020-03-22 DIAGNOSIS — N400 Enlarged prostate without lower urinary tract symptoms: Secondary | ICD-10-CM

## 2020-03-22 DIAGNOSIS — R109 Unspecified abdominal pain: Secondary | ICD-10-CM | POA: Diagnosis not present

## 2020-03-22 LAB — BASIC METABOLIC PANEL
Anion gap: 14 (ref 5–15)
BUN: 15 mg/dL (ref 6–20)
CO2: 21 mmol/L — ABNORMAL LOW (ref 22–32)
Calcium: 9.7 mg/dL (ref 8.9–10.3)
Chloride: 105 mmol/L (ref 98–111)
Creatinine, Ser: 1.11 mg/dL (ref 0.61–1.24)
GFR calc Af Amer: >60 mL/min (ref >60)
GFR calc non Af Amer: >60 mL/min (ref >60)
Glucose, Bld: 143 mg/dL — ABNORMAL HIGH (ref 70–99)
Potassium: 3.5 mmol/L (ref 3.5–5.1)
Sodium: 140 mmol/L (ref 135–145)

## 2020-03-22 LAB — CBC WITH DIFFERENTIAL/PLATELET
Abs Immature Granulocytes: 0.06 10*3/uL (ref 0.00–0.07)
Basophils Absolute: 0.2 10*3/uL — ABNORMAL HIGH (ref 0.0–0.1)
Basophils Relative: 1 %
Eosinophils Absolute: 0 10*3/uL (ref 0.0–0.5)
Eosinophils Relative: 0 %
HCT: 45.6 % (ref 39.0–52.0)
Hemoglobin: 16.3 g/dL (ref 13.0–17.0)
Immature Granulocytes: 0 %
Lymphocytes Relative: 12 %
Lymphs Abs: 1.9 10*3/uL (ref 0.7–4.0)
MCH: 30.4 pg (ref 26.0–34.0)
MCHC: 35.7 g/dL (ref 30.0–36.0)
MCV: 84.9 fL (ref 80.0–100.0)
Monocytes Absolute: 0.5 10*3/uL (ref 0.1–1.0)
Monocytes Relative: 4 %
Neutro Abs: 13 10*3/uL — ABNORMAL HIGH (ref 1.7–7.7)
Neutrophils Relative %: 83 %
Platelets: 223 10*3/uL (ref 150–400)
RBC: 5.37 MIL/uL (ref 4.22–5.81)
RDW: 12 % (ref 11.5–15.5)
WBC: 15.7 10*3/uL — ABNORMAL HIGH (ref 4.0–10.5)
nRBC: 0 % (ref 0.0–0.2)

## 2020-03-22 LAB — URINALYSIS, ROUTINE W REFLEX MICROSCOPIC
Bilirubin Urine: NEGATIVE
Glucose, UA: NEGATIVE mg/dL
Hgb urine dipstick: NEGATIVE
Ketones, ur: 40 mg/dL — AB
Leukocytes,Ua: NEGATIVE
Nitrite: NEGATIVE
Protein, ur: NEGATIVE mg/dL
Specific Gravity, Urine: 1.01 (ref 1.005–1.030)
pH: 7 (ref 5.0–8.0)

## 2020-03-22 LAB — HEPATIC FUNCTION PANEL
ALT: 37 U/L (ref 0–44)
AST: 30 U/L (ref 15–41)
Albumin: 4.7 g/dL (ref 3.5–5.0)
Alkaline Phosphatase: 64 U/L (ref 38–126)
Bilirubin, Direct: 0.1 mg/dL (ref 0.0–0.2)
Indirect Bilirubin: 1.1 mg/dL — ABNORMAL HIGH (ref 0.3–0.9)
Total Bilirubin: 1.2 mg/dL (ref 0.3–1.2)
Total Protein: 7.4 g/dL (ref 6.5–8.1)

## 2020-03-22 LAB — MAGNESIUM: Magnesium: 1.8 mg/dL (ref 1.7–2.4)

## 2020-03-22 LAB — LIPASE, BLOOD: Lipase: 27 U/L (ref 11–51)

## 2020-03-22 LAB — TROPONIN I (HIGH SENSITIVITY)
Troponin I (High Sensitivity): 3 ng/L (ref <18)
Troponin I (High Sensitivity): 3 ng/L (ref <18)

## 2020-03-22 MED ORDER — IOHEXOL 350 MG/ML SOLN
100.0000 mL | Freq: Once | INTRAVENOUS | Status: AC | PRN
  Administered 2020-03-22: 100 mL via INTRAVENOUS

## 2020-03-22 MED ORDER — FENTANYL CITRATE (PF) 100 MCG/2ML IJ SOLN
50.0000 ug | Freq: Once | INTRAMUSCULAR | Status: AC
  Administered 2020-03-22: 50 ug via INTRAVENOUS
  Filled 2020-03-22: qty 2

## 2020-03-22 MED ORDER — HYDROMORPHONE HCL 1 MG/ML IJ SOLN
1.0000 mg | Freq: Once | INTRAMUSCULAR | Status: AC
  Administered 2020-03-22: 1 mg via INTRAVENOUS
  Filled 2020-03-22: qty 1

## 2020-03-22 MED ORDER — SODIUM CHLORIDE 0.9 % IV BOLUS
1000.0000 mL | Freq: Once | INTRAVENOUS | Status: AC
  Administered 2020-03-22: 1000 mL via INTRAVENOUS

## 2020-03-22 MED ORDER — HYDROMORPHONE HCL 1 MG/ML IJ SOLN: 1.0000 mg | INTRAMUSCULAR | Status: DC | PRN

## 2020-03-22 MED ORDER — ONDANSETRON HCL 4 MG/2ML IJ SOLN
4.0000 mg | Freq: Once | INTRAMUSCULAR | Status: AC
  Administered 2020-03-22: 4 mg via INTRAVENOUS
  Filled 2020-03-22: qty 2

## 2020-03-22 MED ORDER — ONDANSETRON 4 MG PO TBDP: ORAL_TABLET | ORAL | 0 refills | Status: AC

## 2020-03-22 NOTE — ED Notes (Signed)
Placed on cont cardiac monitoring with int NBP assessments q15 min, cont POX as well. SR x 2 up, wife at side, callbell within reach, stretcher in lowest position

## 2020-03-22 NOTE — Discharge Instructions (Signed)
Schedule follow-up appointment with primary doctor regarding the episode that you had today.  The radiologist commented that your prostate was mildly enlarged and you should have a blood marker called PSA checked by your primary doctor.  Take Zofran as needed for nausea.  Return to ER if you develop chest pain, abdominal pain, vomiting or other new concerning symptom.

## 2020-03-22 NOTE — ED Notes (Signed)
NPO STATUS IS 1730HRS TODAY, INFORMED TO REMAIN NPO UNTIL FURTHER ORDERS

## 2020-03-22 NOTE — ED Notes (Signed)
Repeat Troponin obtained and to the lab 

## 2020-03-22 NOTE — ED Notes (Signed)
Patient transported to CT 

## 2020-03-22 NOTE — ED Triage Notes (Signed)
Pt here for abdominal pain for 3-4 hours. Pt also has nausea and vomiting.

## 2020-03-22 NOTE — ED Notes (Signed)
Presents with abd pain with nausea and vomiting, onset approx 1300hrs today, pt appears pale, is very restless, c/o primarily of RUQ pain. EDP at bedside upon pt arrival into Exam Room 7. Orders rec and immediately implemented

## 2020-03-22 NOTE — ED Notes (Signed)
Appears very anxious, grimacing and moaning with pain at times, pt reassured, emotional support provided, room lights dimmed to promote relaxation. Pt appears to be closing eyes intermittently at this time. Wife at side

## 2020-03-23 NOTE — ED Provider Notes (Addendum)
[facility] EMERGENCY DEPARTMENT Provider Note   CSN: [id number] Arrival date & time: 03/22/20  1727     History Chief Complaint  Patient presents with  . Abdominal Pain    Mitchell Kim is a 54 y.o. male.  Presents to the ER with sudden onset of abdominal pain radiating to back, nausea, vomiting as well as diaphoresis.  Wife states patient suddenly looked quite ill appearing, had a couple episodes of vomiting, nonbloody nonbilious.  Pain currently 8 out of 10 in severity, center to right upper abdomen going to back.  No alleviating factors, no aggravating factors.  No associated fever.  Had similar episode in November of last year but this seems to be worse.  HPI     History reviewed. No pertinent past medical history.  There are no problems to display for this patient.   Past Surgical History:  Procedure Laterality Date  . KNEE SURGERY         Family History  Problem Relation Age of Onset  . Cancer Mother     Social History   Tobacco Use  . Smoking status: Never Smoker  . Smokeless tobacco: Never Used  Vaping Use  . Vaping Use: Never used  Substance Use Topics  . Alcohol use: Yes    Comment: rare  . Drug use: No    Home Medications Prior to Admission medications   Medication Sig Start Date End Date Taking? Authorizing Provider  Cetirizine HCl (ZYRTEC PO) Take by mouth.    [provider]  HYDROcodone-acetaminophen (NORCO) 5-325 MG tablet Take 1 tablet by mouth every 6 (six) hours as needed (for pain). 11/28/16   Molpus, John, MD  ondansetron (ZOFRAN ODT) 4 MG disintegrating tablet 4mg  ODT q4 hours prn nausea/vomit 03/22/20   05/22/20, MD  pantoprazole (PROTONIX) 40 MG tablet Take 1 tablet (40 mg total) by mouth daily. 07/06/19   07/08/19, MD    Allergies    Patient has no known allergies.  Review of Systems   Review of Systems  Constitutional: Positive for chills. Negative for fever.  HENT: Negative for ear pain and  sore throat.   Eyes: Negative for pain and visual disturbance.  Respiratory: Negative for cough and shortness of breath.   Cardiovascular: Negative for chest pain and palpitations.  Gastrointestinal: Positive for abdominal pain, nausea and vomiting.  Genitourinary: Negative for dysuria and hematuria.  Musculoskeletal: Positive for back pain. Negative for arthralgias.  Skin: Negative for color change and rash.  Neurological: Negative for seizures and syncope.  All other systems reviewed and are negative.   Physical Exam Updated Vital Signs BP 120/74 (BP Location: Right Arm)   Pulse 88   Temp 98.4 F (36.9 C) (Oral)   Resp 16   Ht 6\' 3"  (1.905 m)   Wt 97.5 kg   SpO2 97%   BMI 26.87 kg/m   Physical Exam Vitals and nursing note reviewed.  Constitutional:      Appearance: He is well-developed.     Comments: Mildly diaphoretic, but in no distress  HENT:     Head: Normocephalic and atraumatic.  Eyes:     Conjunctiva/sclera: Conjunctivae normal.  Cardiovascular:     Rate and Rhythm: Normal rate and regular rhythm.     Heart sounds: No murmur heard.   Pulmonary:     Effort: Pulmonary effort is normal. No respiratory distress.     Breath sounds: Normal breath sounds.  Abdominal:     General: Abdomen  is flat.     Palpations: Abdomen is soft.     Tenderness: There is no abdominal tenderness.  Musculoskeletal:     Cervical back: Neck supple.  Skin:    General: Skin is warm.     Capillary Refill: Capillary refill takes less than 2 seconds.     Coloration: Skin is not mottled.     Findings: No erythema.  Neurological:     General: No focal deficit present.     Mental Status: He is alert and oriented to person, place, and time.  Psychiatric:        Mood and Affect: Mood normal.        Behavior: Behavior normal.     ED Results / Procedures / Treatments   Labs (all labs ordered are listed, but only abnormal results are displayed) Labs Reviewed  CBC WITH  DIFFERENTIAL/PLATELET - Abnormal; Notable for the following components:      Result Value   WBC 15.7 (*)    Neutro Abs 13.0 (*)    Basophils Absolute 0.2 (*)    All other components within normal limits  BASIC METABOLIC PANEL - Abnormal; Notable for the following components:   CO2 21 (*)    Glucose, Bld 143 (*)    All other components within normal limits  HEPATIC FUNCTION PANEL - Abnormal; Notable for the following components:   Indirect Bilirubin 1.1 (*)    All other components within normal limits  URINALYSIS, ROUTINE W REFLEX MICROSCOPIC - Abnormal; Notable for the following components:   Ketones, ur 40 (*)    All other components within normal limits  MAGNESIUM  LIPASE, BLOOD  TROPONIN I (HIGH SENSITIVITY)  TROPONIN I (HIGH SENSITIVITY)    EKG EKG Interpretation  Date/Time:  Sunday March 22 2020 18:01:54 EDT Ventricular Rate:  76 PR Interval:    QRS Duration: 105 QT Interval:  399 QTC Calculation: 449 R Axis:   5 Text Interpretation: Sinus rhythm Borderline T wave abnormalities Baseline wander in lead(s) I III aVL V1 Confirmed by Marianna Fussykstra, Kouper Spinella ([id number]) on 03/22/2020 6:19:52 PM Also confirmed by Marianna Fussykstra, Rowland Ericsson ([id number]), editor Elita QuickWatlington, Beverly ([id number])  on 03/23/2020 9:23:18 AM   Radiology CT Angio Chest/Abd/Pel for Dissection W and/or Wo Contrast  Result Date: 03/22/2020 CLINICAL DATA:  Abdominal pain. EXAM: CT ANGIOGRAPHY CHEST, ABDOMEN AND PELVIS TECHNIQUE: Non-contrast CT of the chest was initially obtained. Multidetector CT imaging through the chest, abdomen and pelvis was performed using the standard protocol during bolus administration of intravenous contrast. Multiplanar reconstructed images and MIPs were obtained and reviewed to evaluate the vascular anatomy. CONTRAST:  100mL OMNIPAQUE IOHEXOL 350 MG/ML SOLN COMPARISON:  July 06, 2019 FINDINGS: CTA CHEST FINDINGS Cardiovascular: Satisfactory opacification of the pulmonary arteries to the segmental level. No  evidence of pulmonary embolism. Normal heart size. No pericardial effusion. Mediastinum/Nodes: No enlarged mediastinal, hilar, or axillary lymph nodes. The thyroid gland and trachea demonstrate no significant findings. There is a small hiatal hernia. Lungs/Pleura: Lungs are clear. No pleural effusion or pneumothorax. Musculoskeletal: No chest wall abnormality. No acute or significant osseous findings. Review of the MIP images confirms the above findings. CTA ABDOMEN AND PELVIS FINDINGS VASCULAR Aorta: Mild aortic calcification without aneurysm, dissection, vasculitis or significant stenosis. Celiac: Patent without evidence of aneurysm, dissection, vasculitis or significant stenosis. SMA: Patent without evidence of aneurysm, dissection, vasculitis or significant stenosis. Renals: Both renal arteries are patent without evidence of aneurysm, dissection, vasculitis, fibromuscular dysplasia or significant stenosis. IMA: Patent without evidence of aneurysm, dissection,  vasculitis or significant stenosis. Inflow: Patent without evidence of aneurysm, dissection, vasculitis or significant stenosis. Veins: No obvious venous abnormality within the limitations of this arterial phase study. Review of the MIP images confirms the above findings. NON-VASCULAR Hepatobiliary: No focal liver abnormality is seen. No gallstones, gallbladder wall thickening, or biliary dilatation. Pancreas: Unremarkable. No pancreatic ductal dilatation or surrounding inflammatory changes. Spleen: Normal in size without focal abnormality. A 2.4 cm x 1.5 cm accessory spleen is noted. Adrenals/Urinary Tract: Adrenal glands are unremarkable. Kidneys are normal, without renal calculi, focal lesion, or hydronephrosis. Bladder is unremarkable. Stomach/Bowel: Stomach is within normal limits. Appendix appears normal. No evidence of bowel wall thickening, distention, or inflammatory changes. Noninflamed diverticula are seen within the proximal sigmoid colon.  Lymphatic: No abnormal abdominal or pelvic lymph nodes are identified. Reproductive: There is mild to moderate severity enlargement of the prostate gland. Other: No abdominal wall hernia or abnormality. No abdominopelvic ascites. Musculoskeletal: No acute or significant osseous findings. Review of the MIP images confirms the above findings. IMPRESSION: 1. No evidence of pulmonary embolism. 2. Small hiatal hernia. 3. Noninflamed diverticula within the proximal sigmoid colon. 4. Mild to moderate severity enlargement of the prostate gland. Correlation with PSA values is recommended. 5. Aortic atherosclerosis. Aortic Atherosclerosis (ICD10-I70.0). Electronically Signed   By: Aram Candela M.D.   On: 03/22/2020 19:15   US Abdomen Limited RUQ  Result Date: 03/22/2020 CLINICAL DATA:  Right upper quadrant pain. EXAM: ULTRASOUND ABDOMEN LIMITED RIGHT UPPER QUADRANT COMPARISON:  None. FINDINGS: Gallbladder: Numerous subcentimeter nonmobile echogenic gallbladder polyps per seen. The largest measures approximately 8 mm. No gallstones are identified. There is no evidence of gallbladder wall thickening (2.9 mm). A positive sonographic Eulah Pont sign was noted by the sonographer. Common bile duct: Diameter: 2.6 mm Liver: No focal lesion identified. Within normal limits in parenchymal echogenicity. Portal vein is patent on color Doppler imaging with normal direction of blood flow towards the liver. Other: None. IMPRESSION: 1. Numerous gallbladder polyps without evidence of gallstones or acute cholecystitis. Electronically Signed   By: Aram Candela M.D.   On: 03/22/2020 20:08    Procedures Procedures (including critical care time)  Medications Ordered in ED Medications  sodium chloride 0.9 % bolus 1,000 mL ( Intravenous Stopped 03/22/20 1917)  ondansetron (ZOFRAN) injection 4 mg (4 mg Intravenous Given 03/22/20 1806)  fentaNYL (SUBLIMAZE) injection 50 mcg (50 mcg Intravenous Given 03/22/20 1806)  HYDROmorphone  (DILAUDID) injection 1 mg (1 mg Intravenous Given 03/22/20 1830)  iohexol (OMNIPAQUE) 350 MG/ML injection 100 mL (100 mLs Intravenous Contrast Given 03/22/20 1852)    ED Course  I have reviewed the triage vital signs and the nursing notes.  Pertinent labs & imaging results that were available during my care of the patient were reviewed by me and considered in my medical decision making (see chart for details).    MDM Rules/Calculators/A&P                          54 year old male presenting to ER with sudden onset of abdominal pain radiating to back and reported diaphoresis.  On initial exam in ER, patient had stable vital signs but appeared somewhat pale.  Broad differential given symptomatology, concern for possible biliary, pancreatitis, diverticulitis, acute aortic pathology dissection or rupture.  Proceeded with broad work-up.  Labs grossly stable, CTA negative for acute aortic pathology or other acute abdominal pathology.  Right upper quadrant ultrasound showed gallbladder polyps but no stones, no evidence for  cholecystitis.  Cardiac work-up including EKG and troponin x2 were within normal limits.  Patient was provided symptomatic control with fluids, antiemetics and pain medicine.  He had complete resolution of his symptoms, vital signs remained stable.  Given the work-up and current well appearance, believe patient can be discharged home and managed in medicine setting.  Recommended that he follow-up with primary doctor.  Disclosed incidental finding of enlarged prostate and need for follow-up with PCP.    After the discussed management above, the patient was determined to be safe for discharge.  The patient was in agreement with this plan and all questions regarding their care were answered.  ED return precautions were discussed and the patient will return to the ED with any significant worsening of condition.   Final Clinical Impression(s) / ED Diagnoses Final diagnoses:  Enlarged  prostate  Intractable vomiting with nausea, unspecified vomiting type  Sudden onset of severe abdominal pain    Rx / DC Orders ED Discharge Orders         Ordered    ondansetron (ZOFRAN ODT) 4 MG disintegrating tablet     Discontinue  Reprint     03/22/20 2349           Milagros Loll, MD 03/23/20 1243    Milagros Loll, MD 06/16/20 1630

## 2021-03-11 IMAGING — CT CT ANGIO CHEST-ABD-PELV FOR DISSECTION W/ AND WO/W CM
2 of 9 series · 14 of 46 positions shown, 16 images · non-contrast
Comparison: July 06, 2019

CLINICAL DATA: Abdominal pain.

EXAM:
CT ANGIOGRAPHY CHEST, ABDOMEN AND PELVIS
TECHNIQUE: Non-contrast CT of the chest was initially obtained.

[Series 5: axial arterial · axial · arterial · 0.98mm/px · z∈[-534,+99]mm · 11 of 237 slices shown, 13 images]
[im 13/237  soft-tissue]
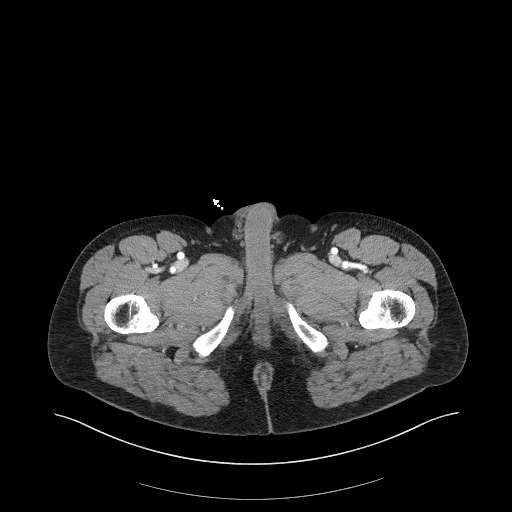
[im 13/237  bone]
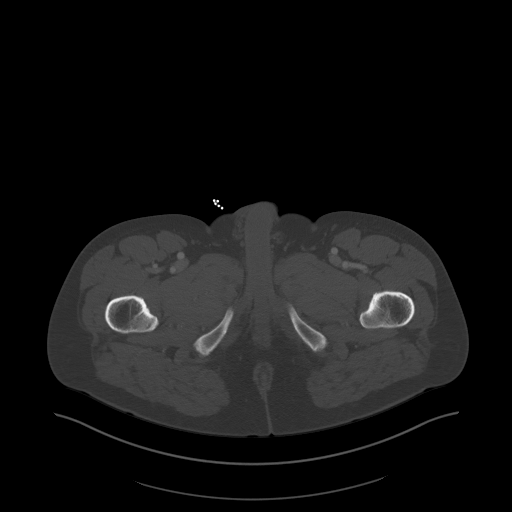
[im 38/237  soft-tissue]
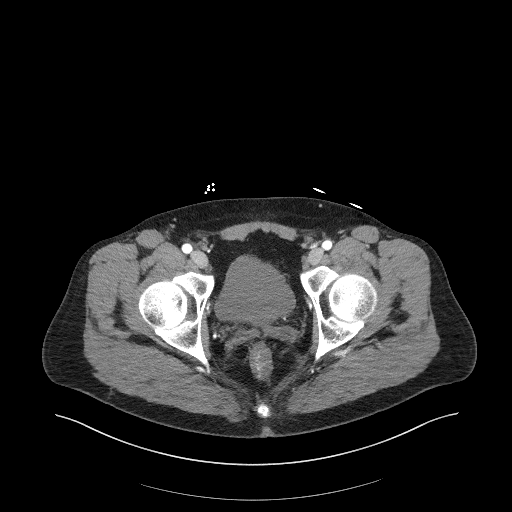
[im 63/237  soft-tissue]
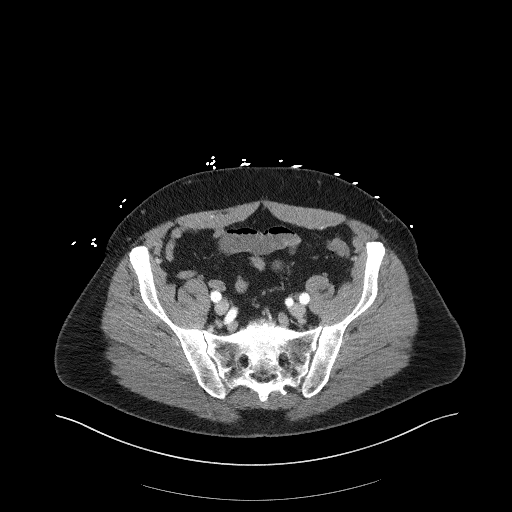
[im 75/237  soft-tissue]
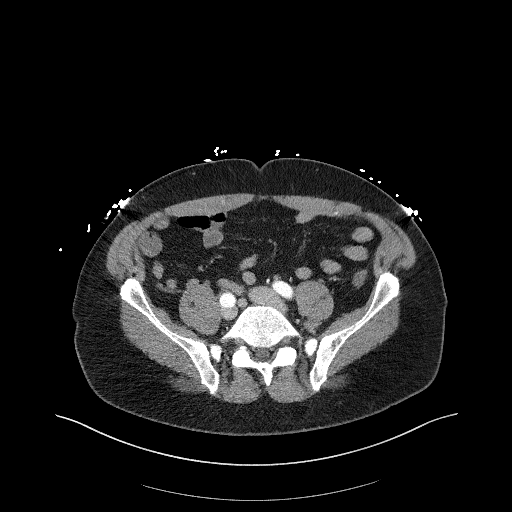
[im 100/237  soft-tissue]
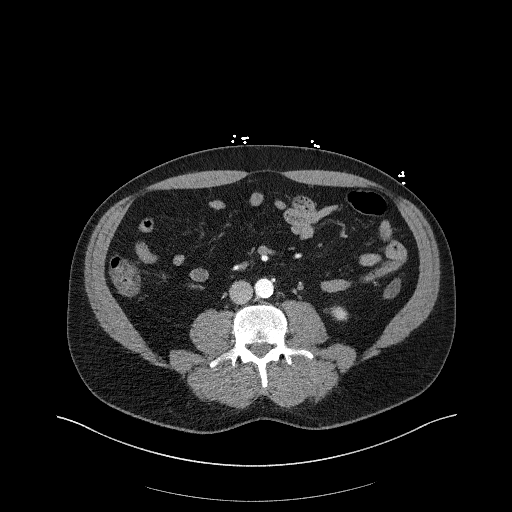
[im 125/237  soft-tissue]
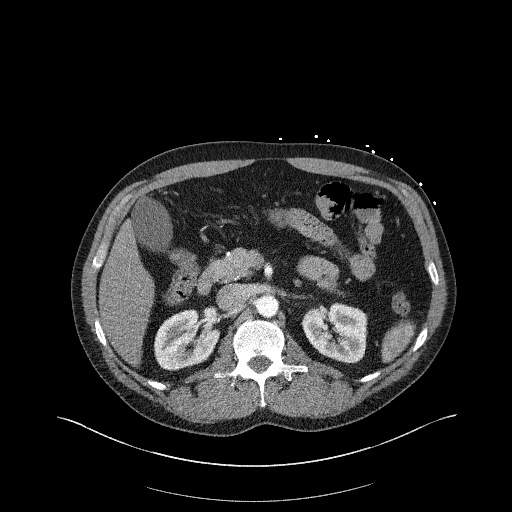
[im 137/237  soft-tissue]
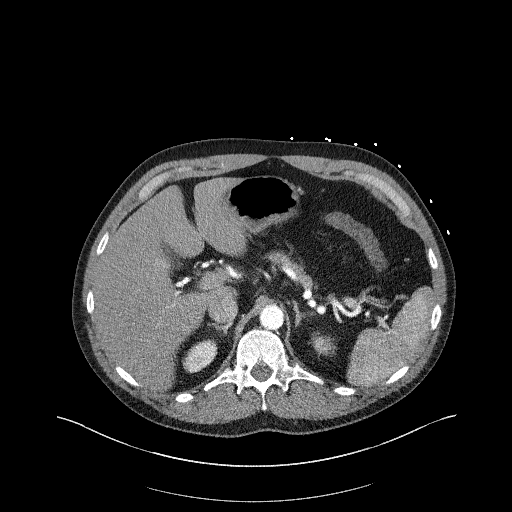
[im 162/237  soft-tissue]
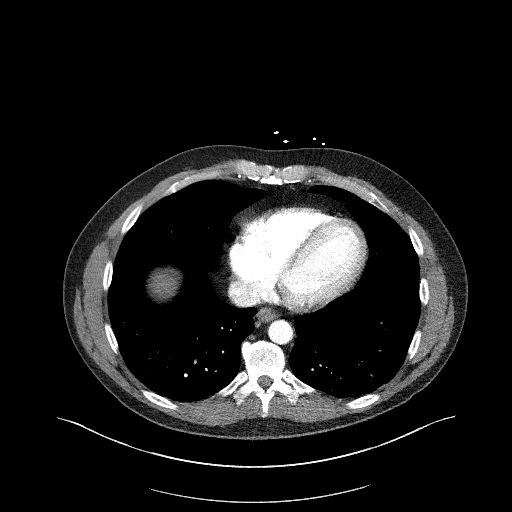
[im 174/237  soft-tissue]
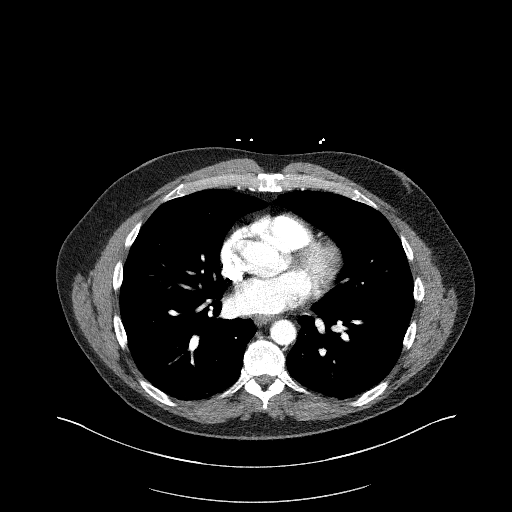
[im 174/237  bone]
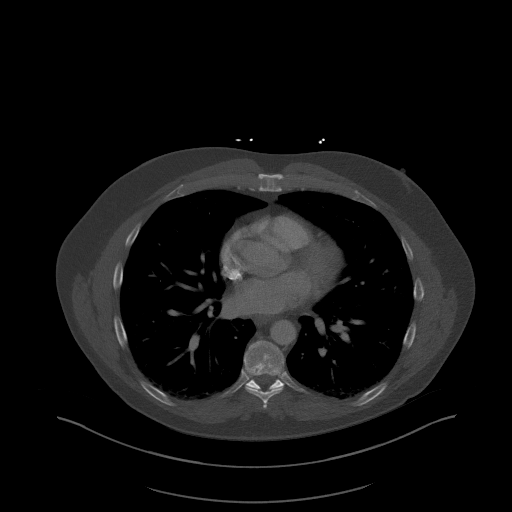
[im 199/237  soft-tissue]
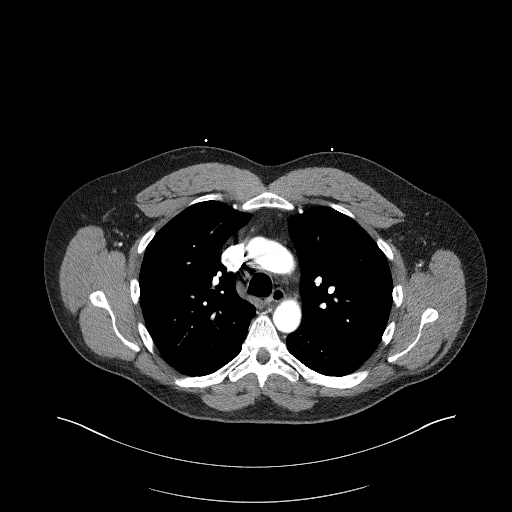
[im 224/237  soft-tissue]
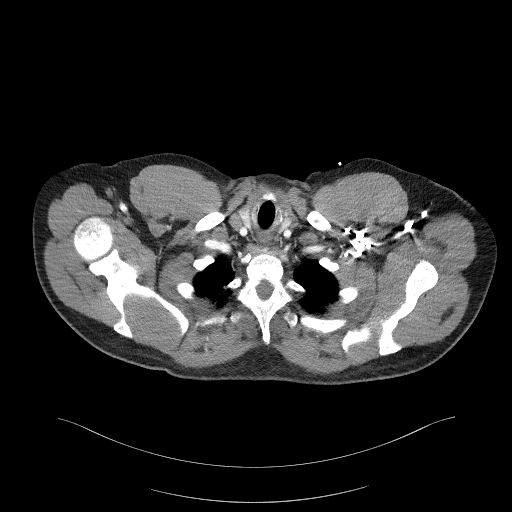

[Series 8: coronals · coronal · 1.06mm/px · 3 of 154 slices shown]
[im 39/154  soft-tissue]
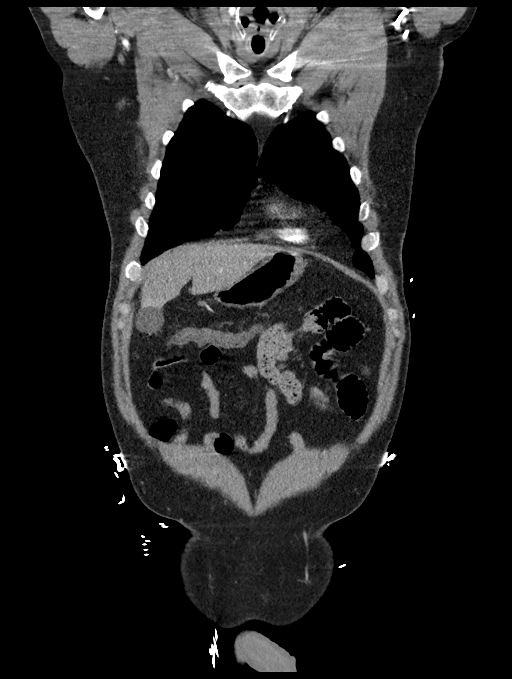
[im 77/154  soft-tissue]
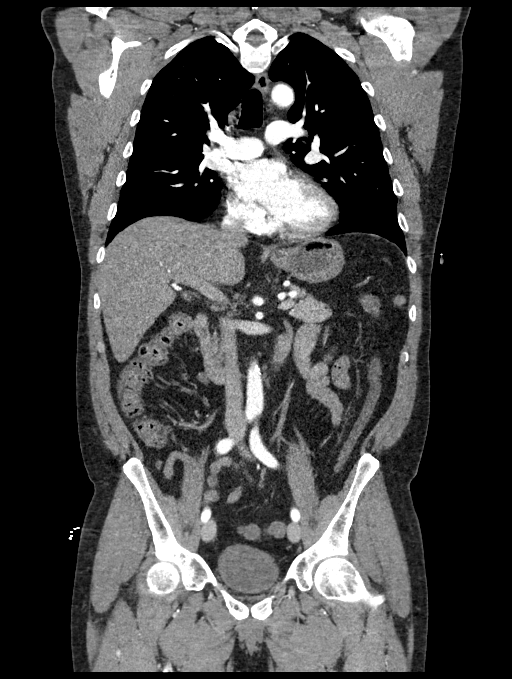
[im 115/154  soft-tissue]
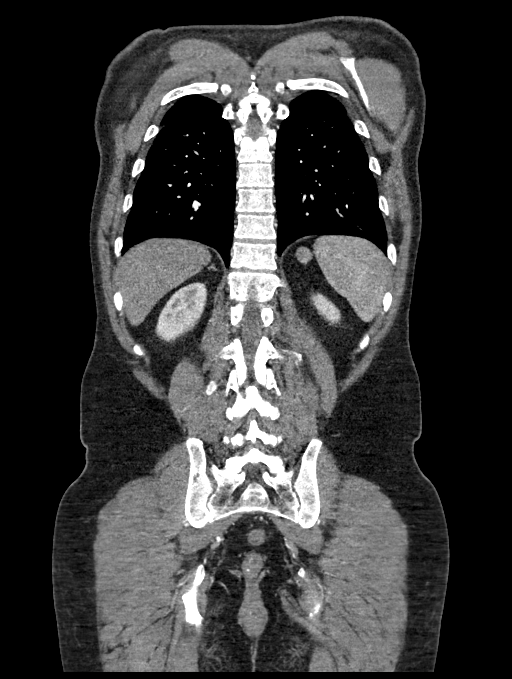

[14 of 46 positions shown; findings below may reference images not displayed]

Multidetector CT imaging through the chest, abdomen and pelvis was
performed using the standard protocol during bolus administration of
intravenous contrast. Multiplanar reconstructed images and MIPs were
obtained and reviewed to evaluate the vascular anatomy.

CONTRAST:  100mL OMNIPAQUE IOHEXOL 350 MG/ML SOLN
FINDINGS: CTA CHEST FINDINGS

Cardiovascular: Satisfactory opacification of the pulmonary arteries
to the segmental level. No evidence of pulmonary embolism. Normal
heart size. No pericardial effusion.

Mediastinum/Nodes: No enlarged mediastinal, hilar, or axillary lymph
nodes. The thyroid gland and trachea demonstrate no significant
findings. There is a small hiatal hernia.

Lungs/Pleura: Lungs are clear. No pleural effusion or pneumothorax.

Musculoskeletal: No chest wall abnormality. No acute or significant
osseous findings.

Review of the MIP images confirms the above findings.

CTA ABDOMEN AND PELVIS FINDINGS

VASCULAR

Aorta: Mild aortic calcification without aneurysm, dissection,
vasculitis or significant stenosis.

Celiac: Patent without evidence of aneurysm, dissection, vasculitis
or significant stenosis.

SMA: Patent without evidence of aneurysm, dissection, vasculitis or
significant stenosis.

Renals: Both renal arteries are patent without evidence of aneurysm,
dissection, vasculitis, fibromuscular dysplasia or significant
stenosis.

IMA: Patent without evidence of aneurysm, dissection, vasculitis or
significant stenosis.

Inflow: Patent without evidence of aneurysm, dissection, vasculitis
or significant stenosis.

Veins: No obvious venous abnormality within the limitations of this
arterial phase study.

Review of the MIP images confirms the above findings.

NON-VASCULAR

Hepatobiliary: No focal liver abnormality is seen. No gallstones,
gallbladder wall thickening, or biliary dilatation.

Pancreas: Unremarkable. No pancreatic ductal dilatation or
surrounding inflammatory changes.

Spleen: Normal in size without focal abnormality. A 2.4 cm x 1.5 cm
accessory spleen is noted.

Adrenals/Urinary Tract: Adrenal glands are unremarkable. Kidneys are
normal, without renal calculi, focal lesion, or hydronephrosis.
Bladder is unremarkable.

Stomach/Bowel: Stomach is within normal limits. Appendix appears
normal. No evidence of bowel wall thickening, distention, or
inflammatory changes. Noninflamed diverticula are seen within the
proximal sigmoid colon.

Lymphatic: No abnormal abdominal or pelvic lymph nodes are
identified.

Reproductive: There is mild to moderate severity enlargement of the
prostate gland.

Other: No abdominal wall hernia or abnormality. No abdominopelvic
ascites.

Musculoskeletal: No acute or significant osseous findings.

Review of the MIP images confirms the above findings.
IMPRESSION: 1. No evidence of pulmonary embolism.
2. Small hiatal hernia.
3. Noninflamed diverticula within the proximal sigmoid colon.
4. Mild to moderate severity enlargement of the prostate gland.
Correlation with PSA values is recommended.
5. Aortic atherosclerosis.

Aortic Atherosclerosis ([id number]).
